# Patient Record
Sex: Male | Born: 2005 | Race: White | Hispanic: No | Marital: Single | Smoking: Never smoker
Health system: Southern US, Community
[De-identification: ages and names within clinical notes are randomized; demographics above are authoritative.]

## PROBLEM LIST (undated history)

## (undated) DIAGNOSIS — Z8489 Family history of other specified conditions: Secondary | ICD-10-CM

## (undated) DIAGNOSIS — F909 Attention-deficit hyperactivity disorder, unspecified type: Secondary | ICD-10-CM

## (undated) DIAGNOSIS — J302 Other seasonal allergic rhinitis: Secondary | ICD-10-CM

## (undated) DIAGNOSIS — R6250 Unspecified lack of expected normal physiological development in childhood: Secondary | ICD-10-CM

## (undated) DIAGNOSIS — H539 Unspecified visual disturbance: Secondary | ICD-10-CM

## (undated) DIAGNOSIS — K529 Noninfective gastroenteritis and colitis, unspecified: Secondary | ICD-10-CM

## (undated) DIAGNOSIS — R109 Unspecified abdominal pain: Secondary | ICD-10-CM

---
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
## 2023-01-05 ENCOUNTER — Encounter: Payer: Self-pay | Admitting: Family Medicine

## 2023-01-05 ENCOUNTER — Ambulatory Visit (INDEPENDENT_AMBULATORY_CARE_PROVIDER_SITE_OTHER): Payer: Commercial Managed Care - PPO | Admitting: Family Medicine

## 2023-01-05 VITALS — BP 134/67 | HR 66 | Ht 70.0 in | Wt 180.0 lb

## 2023-01-05 DIAGNOSIS — Z0000 Encounter for general adult medical examination without abnormal findings: Secondary | ICD-10-CM | POA: Diagnosis not present

## 2023-01-05 NOTE — Progress Notes (Signed)
Complete physical exam  Patient: Gregg Farmer   DOB: May 18, 2006   17 y.o. Male  MRN: [id number]  Subjective:    Chief Complaint  Patient presents with   Annual Exam    Antaeus Rideout is a 17 y.o. male who presents today for a complete physical exam. He reports consuming a general diet. The patient does not participate in regular exercise at present. He generally feels well. He reports sleeping well. He does not have additional problems to discuss today.   Currently lives with: mom Acute concerns or interim problems since last visit: no  Vision concerns: no concerns Dental concerns: no concerns STD concerns: no concerns, not sexually active   Patient denies ETOH use. Patient endorses nicotine use - daily vaping Patient denies illegal substance use.      Most recent fall risk assessment:    01/05/2023    2:54 PM  Fall Risk   Falls in the past year? 0  Number falls in past yr: 0  Injury with Fall? 0  Risk for fall due to : No Fall Risks  Follow up Falls evaluation completed     Most recent depression screenings:    10/09/2022    3:31 PM 09/26/2021    4:35 PM  PHQ 2/9 Scores  PHQ - 2 Score 4 4  PHQ- 9 Score 13 16  Following with psych for counseling and management.         Patient Active Problem List   Diagnosis Date Noted   Anxiety and depression 10/09/2022   Screening for HIV (human immunodeficiency virus) 09/26/2021   Past Medical History:  Diagnosis Date   Abdominal pain    ADHD (attention deficit hyperactivity disorder)    Chronic diarrhea    constipation, nausea and vomiting   Developmental delay    Family history of adverse reaction to anesthesia    Mother had a headache from spinal tap with c-section   Seasonal allergies    Vision abnormalities    wears glasses   No Known Allergies    Patient Care Team: Clayborne Dana, NP as PCP - General (Family Medicine)   Outpatient Medications Prior to Visit  Medication Sig   gabapentin  (NEURONTIN) 300 MG capsule Take 300 mg by mouth daily.   guanFACINE (TENEX) 1 MG tablet Take 1 mg by mouth at bedtime.   lamoTRIgine (LAMICTAL) 25 MG tablet Take 50 mg by mouth daily.   traZODone (DESYREL) 50 MG tablet Take 25 mg by mouth at bedtime as needed.   No facility-administered medications prior to visit.    ROS All review of systems negative except what is listed in the HPI        Objective:     BP 134/67   Pulse 66   Ht 5\' 10"  (1.778 m)   Wt 180 lb (81.6 kg)   SpO2 99%   BMI 25.83 kg/m     Physical Exam Vitals reviewed.  Constitutional:      General: He is not in acute distress.    Appearance: Normal appearance. He is not ill-appearing.  HENT:     Head: Normocephalic and atraumatic.     Right Ear: Tympanic membrane normal.     Left Ear: Tympanic membrane normal.     Nose: Nose normal.     Mouth/Throat:     Mouth: Mucous membranes are moist.     Pharynx: Oropharynx is clear.  Eyes:     Extraocular Movements: Extraocular movements intact.  Conjunctiva/sclera: Conjunctivae normal.     Pupils: Pupils are equal, round, and reactive to light.  Neck:     Vascular: No carotid bruit.  Cardiovascular:     Rate and Rhythm: Normal rate and regular rhythm.     Pulses: Normal pulses.     Heart sounds: Normal heart sounds.  Pulmonary:     Effort: Pulmonary effort is normal.     Breath sounds: Normal breath sounds.  Abdominal:     General: Abdomen is flat. Bowel sounds are normal. There is no distension.     Palpations: Abdomen is soft. There is no mass.     Tenderness: There is no abdominal tenderness. There is no right CVA tenderness, left CVA tenderness, guarding or rebound.  Genitourinary:    Comments: Deferred exam Musculoskeletal:        General: Normal range of motion.     Cervical back: Normal range of motion and neck supple. No tenderness.     Right lower leg: No edema.     Left lower leg: No edema.  Lymphadenopathy:     Cervical: No cervical  adenopathy.  Skin:    General: Skin is warm and dry.     Capillary Refill: Capillary refill takes less than 2 seconds.  Neurological:     General: No focal deficit present.     Mental Status: He is alert and oriented to person, place, and time. Mental status is at baseline.  Psychiatric:        Mood and Affect: Mood normal.        Behavior: Behavior normal.        Thought Content: Thought content normal.        Judgment: Judgment normal.      No results found for any visits on 01/05/23.     Assessment & Plan:    Routine Health Maintenance and Physical Exam Discussed health promotion and safety including diet and exercise recommendations, dental health, and injury prevention. Tobacco cessation if applicable. Seat belts, sunscreen, smoke detectors, etc.    Immunization History  Administered Date(s) Administered   COVID-19, mRNA, vaccine(Comirnaty)12 years and older 12/13/2021   DTaP 11/20/2003, 01/15/2004, 03/26/2004, 12/25/2004, 10/21/2007   HIB (PRP-T) 01/15/2004, 03/26/2004, 04/21/2004, 12/25/2004   Hepatitis A 05/02/2005   Hepatitis A, Ped/Adol-2 Dose 10/15/2006   Hepatitis B 10/04/2003, 03/26/2004, 09/19/2004   Hpv-Unspecified 02/21/2016, 05/18/2017   IPV 11/20/2003, 01/15/2004, 05/02/2005, 10/21/2007   Influenza Nasal 05/10/2008, 05/31/2009, 07/19/2010, 06/06/2011, 07/08/2012, 06/16/2013   Influenza Whole 06/10/2021   Influenza-Unspecified 09/26/2005, 10/21/2007, 06/21/2014, 06/21/2015, 06/12/2016, 08/07/2017, 05/24/2018, 08/05/2019, 06/26/2022   MMR 09/19/2004, 10/21/2007   Meningococcal Mcv4o 01/10/2015   PFIZER(Purple Top)SARS-COV-2 Vaccination 12/13/2019, 01/04/2020, 09/06/2020   Pneumococcal-Unspecified 11/20/2003, 01/15/2004, 03/26/2004, 12/25/2004   Tdap 01/02/2014   Varicella 09/19/2004, 10/21/2007    Health Maintenance  Topic Date Due   INFLUENZA VACCINE  04/02/2023   DTaP/Tdap/Td (7 - Td or Tdap) 01/03/2024   HPV VACCINES  Completed   Hepatitis C  Screening  Completed   HIV Screening  Completed   COVID-19 Vaccine  Discontinued        Problem List Items Addressed This Visit   None Visit Diagnoses     Annual physical exam    -  Primary Doing well overall. No acute concerns. Discussed safety, health promotion, preventative care. Encouraged cessation of vaping and marijuana.  Deferred labs today.       Return in about 1 year (around 01/05/2024) for physical.     Clayborne Dana,  NP

## 2023-03-04 ENCOUNTER — Encounter (INDEPENDENT_AMBULATORY_CARE_PROVIDER_SITE_OTHER): Payer: Commercial Managed Care - PPO | Admitting: Family Medicine

## 2023-03-04 DIAGNOSIS — K219 Gastro-esophageal reflux disease without esophagitis: Secondary | ICD-10-CM

## 2023-03-09 MED ORDER — OMEPRAZOLE 20 MG PO CPDR: 20.0000 mg | DELAYED_RELEASE_CAPSULE | Freq: Every day | ORAL | 3 refills | Status: AC

## 2023-03-09 NOTE — Telephone Encounter (Signed)
